# Patient Record
Sex: Female | Born: 1985 | Race: White | Hispanic: No | Marital: Single | Smoking: Never smoker
Health system: Southern US, Community
[De-identification: ages and names within clinical notes are randomized; demographics above are authoritative.]

## PROBLEM LIST (undated history)

## (undated) DIAGNOSIS — I10 Essential (primary) hypertension: Secondary | ICD-10-CM

## (undated) HISTORY — PX: CERVICAL CERCLAGE: SHX1329

---

## 2008-12-15 ENCOUNTER — Emergency Department (HOSPITAL_COMMUNITY): Admission: EM | Admit: 2008-12-15 | Discharge: 2008-12-15 | Payer: Self-pay | Admitting: Emergency Medicine

## 2014-04-24 ENCOUNTER — Other Ambulatory Visit (HOSPITAL_COMMUNITY): Payer: Self-pay | Admitting: Nurse Practitioner

## 2014-04-24 DIAGNOSIS — Z3A30 30 weeks gestation of pregnancy: Secondary | ICD-10-CM

## 2014-04-24 DIAGNOSIS — Z3689 Encounter for other specified antenatal screening: Secondary | ICD-10-CM

## 2014-04-24 DIAGNOSIS — O283 Abnormal ultrasonic finding on antenatal screening of mother: Secondary | ICD-10-CM

## 2014-05-02 ENCOUNTER — Encounter (HOSPITAL_COMMUNITY): Payer: Self-pay

## 2014-05-02 ENCOUNTER — Other Ambulatory Visit (HOSPITAL_COMMUNITY): Payer: Self-pay

## 2014-05-02 ENCOUNTER — Ambulatory Visit (HOSPITAL_COMMUNITY)
Admission: RE | Admit: 2014-05-02 | Discharge: 2014-05-02 | Disposition: A | Discharge status: Home / Self Care | Payer: Medicaid Other | Source: Ambulatory Visit | Attending: Nurse Practitioner | Admitting: Nurse Practitioner

## 2014-05-02 DIAGNOSIS — Z36 Encounter for antenatal screening of mother: Secondary | ICD-10-CM | POA: Diagnosis not present

## 2014-05-02 DIAGNOSIS — O289 Unspecified abnormal findings on antenatal screening of mother: Secondary | ICD-10-CM | POA: Insufficient documentation

## 2014-05-02 DIAGNOSIS — Z3689 Encounter for other specified antenatal screening: Secondary | ICD-10-CM | POA: Insufficient documentation

## 2014-05-02 DIAGNOSIS — O283 Abnormal ultrasonic finding on antenatal screening of mother: Secondary | ICD-10-CM

## 2014-05-02 DIAGNOSIS — Z3A30 30 weeks gestation of pregnancy: Secondary | ICD-10-CM | POA: Insufficient documentation

## 2014-05-02 HISTORY — DX: Essential (primary) hypertension: I10

## 2014-05-02 IMAGING — US US OB DETAIL+14 WK
1 series · 12 of 28 positions shown · non-contrast
Comparison: none

[Series 1: us ob detail+14 wk · 12 of 94 slices shown]
[im 4/94]
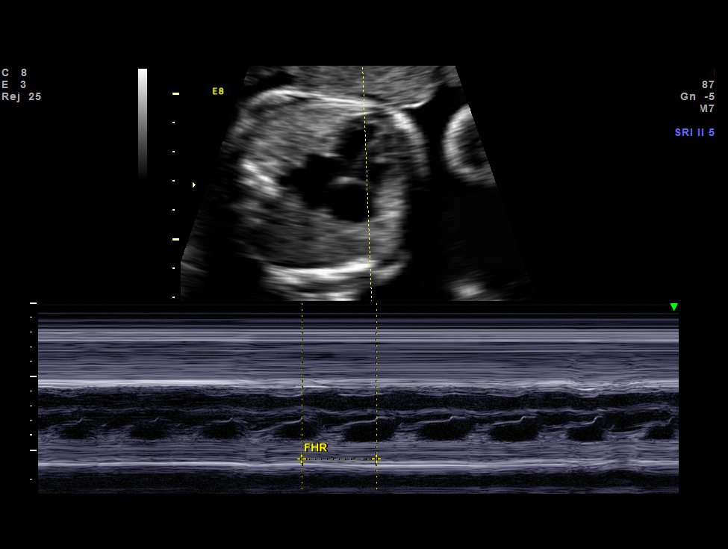
[im 11/94]
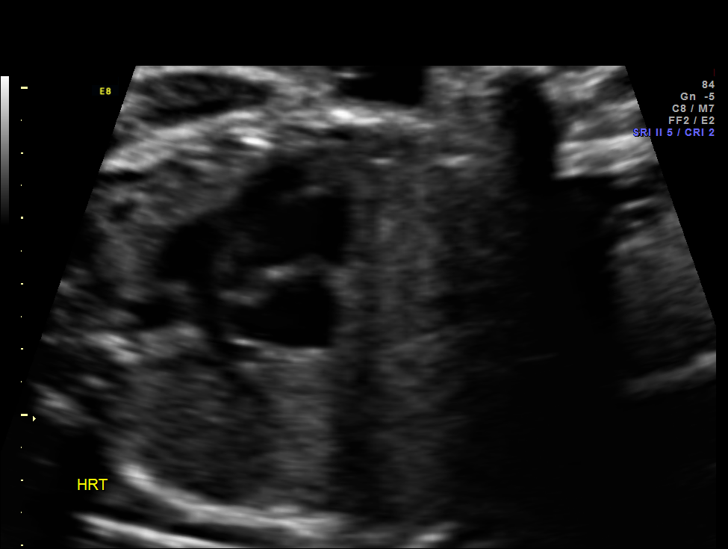
[im 18/94]
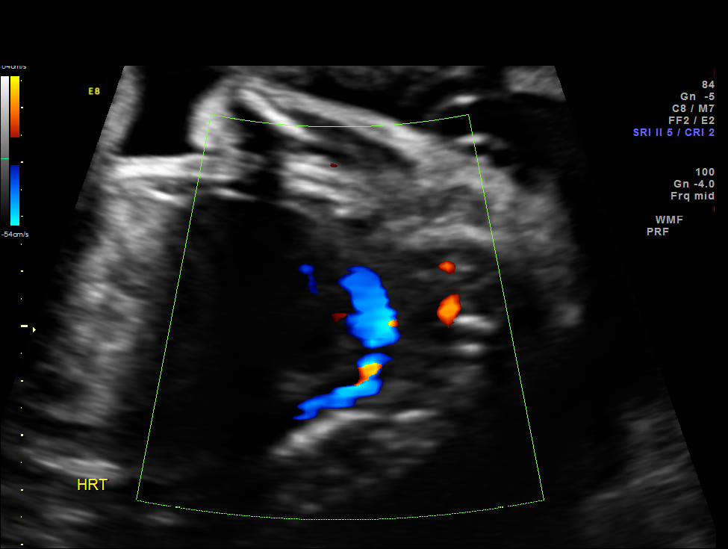
[im 28/94]
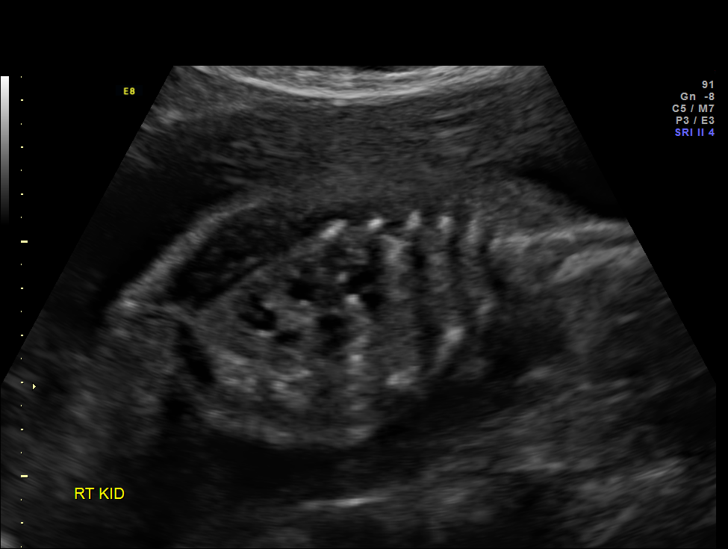
[im 35/94]
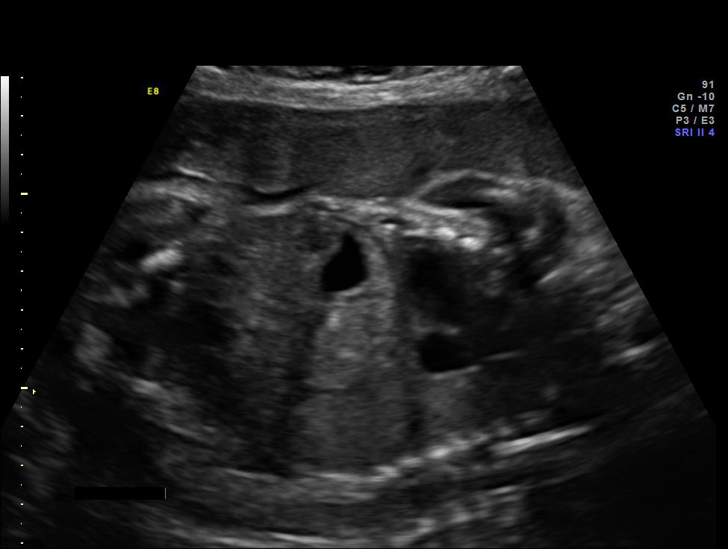
[im 42/94]
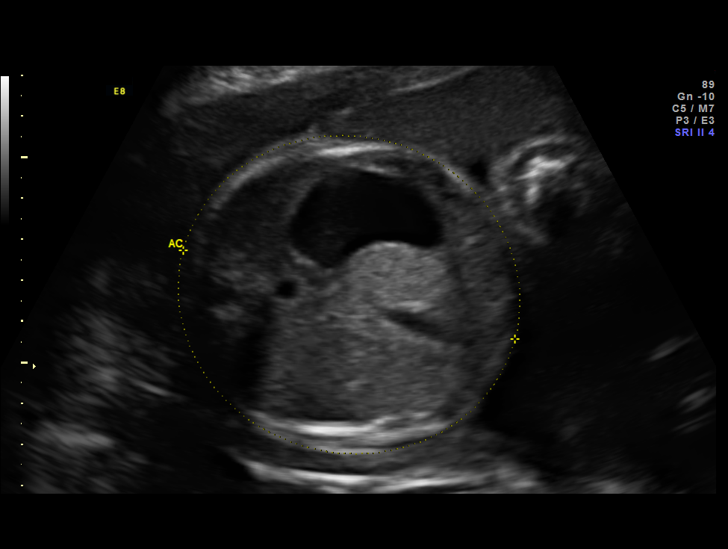
[im 52/94]
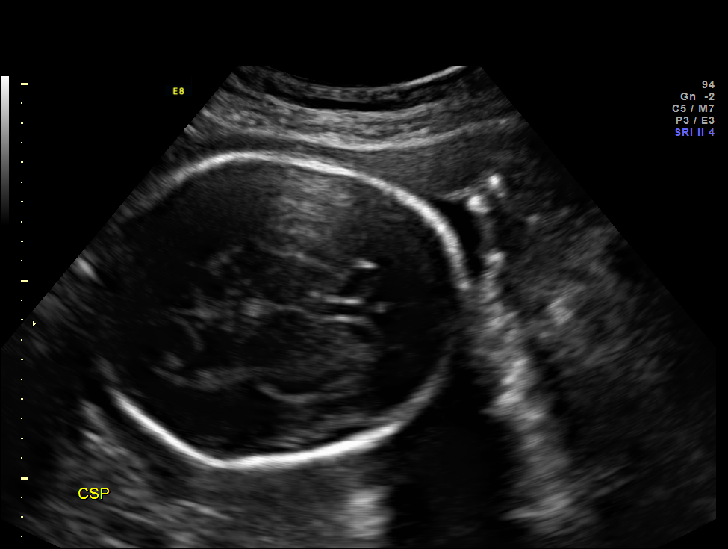
[im 59/94]
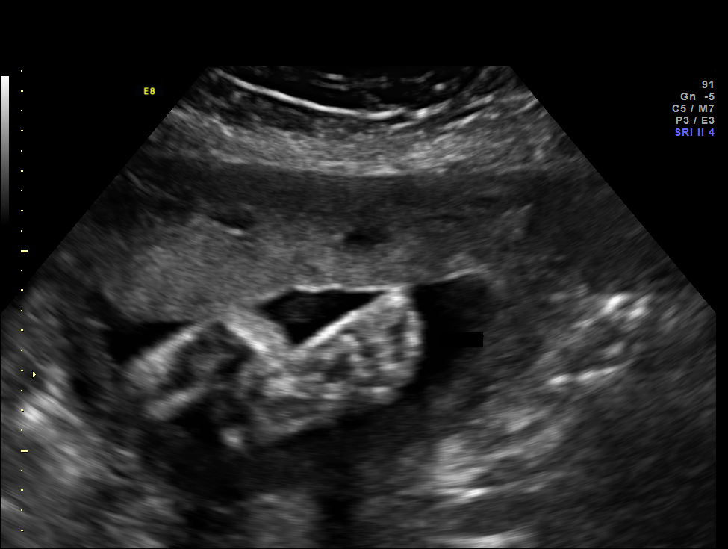
[im 66/94]
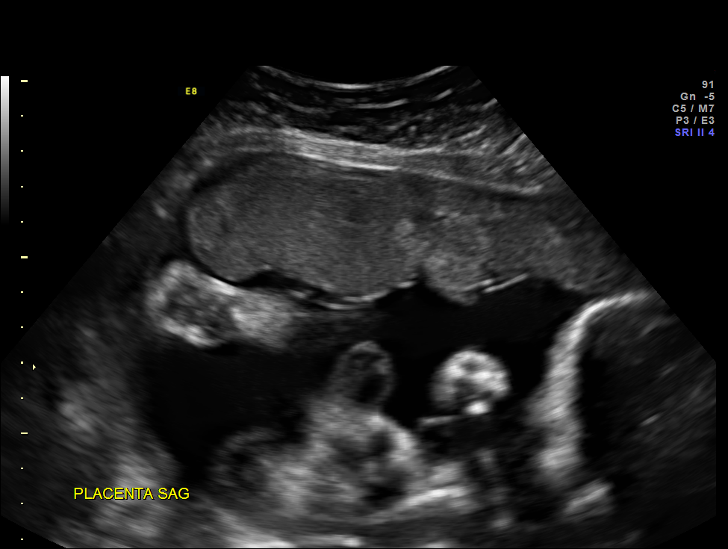
[im 76/94]
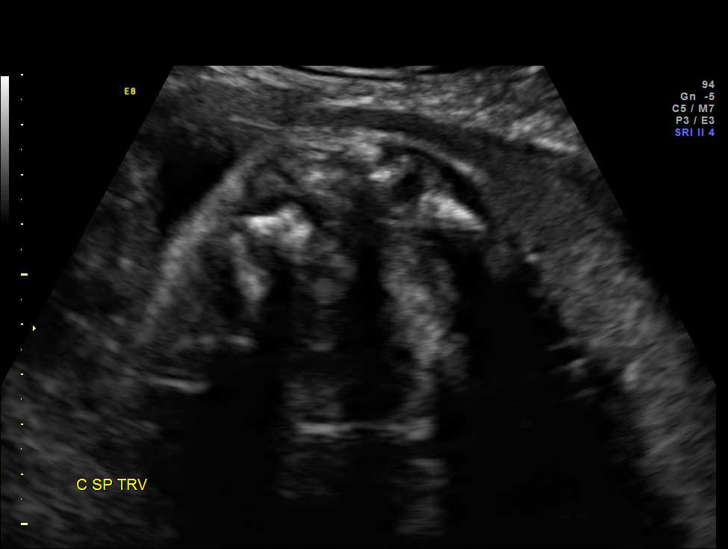
[im 83/94]
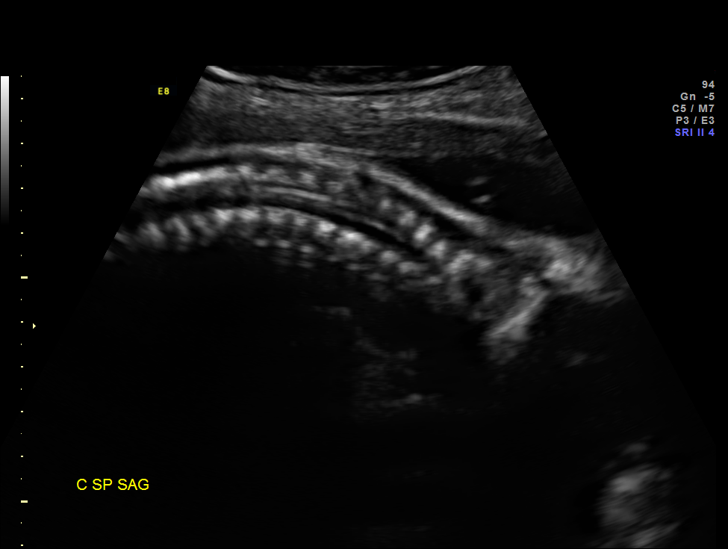
[im 90/94]
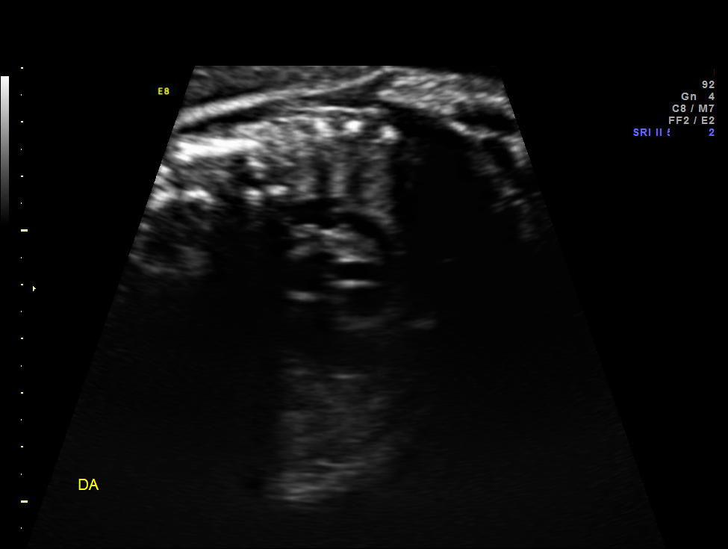

[12 of 28 positions shown; findings below may reference images not displayed]

OBSTETRICS REPORT
                      (Signed Final [DATE] [DATE])

Service(s) Provided

 US OB DETAIL + 14 WK                                  76811.0
Indications

 Detailed fetal anatomic survey                        Z36
 Urinary tract dilation of fetus on prenatal [ZK]
 Size less than dates (Small for gestational age,      [ZK]
 FGR)
 Hypertension - Chronic/Pre-existing (on labetalol)    [ZK]
 Poor obstetric history: Previous midtrimester loss    [ZK]
 (21 week demise, abruption, dilated cervix)
 Cervical insufficiency,3rd - cerclage [DATE],        [ZK]
 SAUSHAN injections
 30 weeks gestation of pregnancy
Fetal Evaluation

 Num Of Fetuses:    1
 Fetal Heart Rate:  135                          bpm
 Cardiac Activity:  Observed
 Presentation:      Cephalic
 Placenta:          Anterior, above cervical os
 P. Cord            Visualized, central
 Insertion:

 Amniotic Fluid
 AFI FV:      Subjectively within normal limits
 AFI Sum:     18.16   cm       68  %Tile      Larg Pckt:   5.87  cm
 RUQ:   5.87    cm   RLQ:    2.77   cm    LUQ:   4.97    cm    LLQ:   4.55    cm
Biometry

 BPD:     76.7  mm     G. Age:  30w 5d                CI:          77.8  70 - 86
 OFD:     98.6  mm                                    FL/HC:       19.2  19.2 -

 HC:     280.9  mm     G. Age:  30w 6d       33   %   HC/AC:       1.12  0.99 -

 AC:     251.5  mm     G. Age:  29w 2d       23   %   FL/BPD:      70.4  71 - 87
 FL:        54  mm     G. Age:  28w 4d         6  %   FL/AC:       21.5  20 - 24
 HUM:     49.4  mm     G. Age:  29w 0d       29   %
 CER:     36.3  mm     G. Age:  31w 2d       63   %

 Est. FW:    [ZK]   gm     3 lb 1 oz     37  %
Gestational Age

 LMP:           31w 4d        Date:  [DATE]                 EDD:   [DATE]
 U/S Today:     29w 6d                                        EDD:   [DATE]
 Best:          30w 1d     Det. By:  Early Ultrasound         EDD:   [DATE]
                                     ([DATE])
Anatomy

 Cranium:          Appears normal         Aortic Arch:      Appears normal
 Fetal Cavum:      Appears normal         Ductal Arch:      Appears normal
 Ventricles:       Appears normal         Diaphragm:        Appears normal
 Choroid Plexus:   Appears normal         Stomach:          Appears normal, left
                                                            sided
 Cerebellum:       Appears normal         Abdomen:          Appears normal
 Posterior Fossa:  Appears normal         Abdominal Wall:   Appears nml (cord
                                                            insert, abd wall)
 Nuchal Fold:      Not applicable (>20    Cord Vessels:     Appears normal (3
                   wks GA)                                  vessel cord)
 Face:             Appears normal         Kidneys:          Abnormal, see
                   (orbits and profile)
                                                            comments
 Lips:             Appears normal         Bladder:          Appears normal
 Heart:            Appears normal         Spine:            Appears normal
                   (4CH, axis, and
                   situs)
 RVOT:             Appears normal         Lower             Visualized
                                          Extremities:
 LVOT:             Appears normal         Upper             Visualized
                                          Extremities:

 Other:  Male gender. Heels visualized. Nasal bone visualized. Technically
         difficult due to advanced GA and fetal position.
Targeted Anatomy

 Fetal Central Nervous System
 Cisterna Magna:
Cervix Uterus Adnexa

 Cervix:       Not visualized (advanced GA >[ZK])
Impression

 SIUP at [ZK]
 EFW 37%
 right sided pyelectasis/urinary tract dilation:  10.8mm right
 renal pelvis AP, left renal pelvis AP 4mm
 No previa
Recommendations

 1. antenatal consultation with pediatric urology
 2. interval growth in 4 weeks.
 3. antenatal testing (NST 2x/wk with AFI weekly).

## 2014-05-02 NOTE — ED Notes (Signed)
Pt forgot to take her BP med yesterday.

## 2014-05-03 ENCOUNTER — Other Ambulatory Visit (HOSPITAL_COMMUNITY): Payer: Self-pay | Admitting: Nurse Practitioner

## 2014-05-03 DIAGNOSIS — O283 Abnormal ultrasonic finding on antenatal screening of mother: Secondary | ICD-10-CM

## 2014-06-02 ENCOUNTER — Other Ambulatory Visit (HOSPITAL_COMMUNITY): Payer: Self-pay | Admitting: Nurse Practitioner

## 2014-06-02 ENCOUNTER — Ambulatory Visit (HOSPITAL_COMMUNITY)
Admission: RE | Admit: 2014-06-02 | Discharge: 2014-06-02 | Disposition: A | Discharge status: Home / Self Care | Payer: Medicaid Other | Source: Ambulatory Visit | Attending: Nurse Practitioner | Admitting: Nurse Practitioner

## 2014-06-02 DIAGNOSIS — Z3A34 34 weeks gestation of pregnancy: Secondary | ICD-10-CM | POA: Insufficient documentation

## 2014-06-02 DIAGNOSIS — O289 Unspecified abnormal findings on antenatal screening of mother: Secondary | ICD-10-CM | POA: Diagnosis present

## 2014-06-02 DIAGNOSIS — O10919 Unspecified pre-existing hypertension complicating pregnancy, unspecified trimester: Secondary | ICD-10-CM | POA: Insufficient documentation

## 2014-06-02 DIAGNOSIS — O283 Abnormal ultrasonic finding on antenatal screening of mother: Secondary | ICD-10-CM

## 2014-06-02 NOTE — ED Notes (Signed)
Upon bringing patient back for vital signs, she realized she had not taken her blood pressure medicine.  Provided patient with water to take medicine.

## 2015-03-07 ENCOUNTER — Encounter (HOSPITAL_COMMUNITY): Payer: Self-pay | Admitting: *Deleted

## 2021-10-08 ENCOUNTER — Encounter: Payer: Self-pay | Admitting: Physical Therapy

## 2021-10-08 ENCOUNTER — Ambulatory Visit: Payer: Medicaid Other | Attending: Internal Medicine | Admitting: Physical Therapy

## 2021-10-08 DIAGNOSIS — M6281 Muscle weakness (generalized): Secondary | ICD-10-CM | POA: Diagnosis present

## 2021-10-08 DIAGNOSIS — M542 Cervicalgia: Secondary | ICD-10-CM

## 2021-10-08 DIAGNOSIS — G8929 Other chronic pain: Secondary | ICD-10-CM

## 2021-10-08 DIAGNOSIS — M25511 Pain in right shoulder: Secondary | ICD-10-CM | POA: Insufficient documentation

## 2021-10-08 NOTE — Therapy (Signed)
[facility] ?[address] ?[place], [place], [address] ?Phone: [phone]   Fax:  [phone] ? ?Physical Therapy Evaluation ? ?Patient Details  ?Name: Carol DupreSandra L Lowe ?MRN: [id number] ?Date of Birth: 11-03-1985 ?Referring Provider (PT): Gilman Schmidtickey Kirkland NP ? ? ?Encounter Date: 10/08/2021 ? ? PT End of Session - 10/08/21 1238   ? ? Visit Number 1   ? Number of Visits 12   ? Date for PT Re-Evaluation 11/19/21   ? PT Start Time 1032   ? PT Stop Time 1100   ? PT Time Calculation (min) 28 min   ? Activity Tolerance Patient tolerated treatment well   ? Behavior During Therapy [facility] for tasks assessed/performed   ? ?  ?  ? ?  ? ? ?Past Medical History:  ?Diagnosis Date  ? Hypertension   ? ? ?Past Surgical History:  ?Procedure Laterality Date  ? CERVICAL CERCLAGE    ? ? ?There were no vitals filed for this visit. ? ? ? Subjective Assessment - 10/08/21 1240   ? ? Subjective The patient presents to the clinic today with c/o chronic right-sided neck and shoulder pain as the result of an injury in 2013.  Her pain-level is rated at a 7/10 today. She will also experience numbness in her right hand especially the fourth and fifth fingers.  Injections in the past provided her some temporary relief.  Certain neck and right shoulder movements increase pain.   ? Pertinent History C-section x 2, HTN.   ? Patient Stated Goals Get out of pain.   ? Currently in Pain? Yes   ? Pain Score 7    ? Pain Location Neck   Right shoulder.  ? Pain Orientation Right   ? Pain Descriptors / Indicators Aching;Throbbing;Sharp;Numbness;Tingling   ? Pain Type Chronic pain   ? Pain Radiating Towards Right shoulder.   ? Pain Onset More than a month ago   ? Pain Frequency Constant   ? Aggravating Factors  See above.   ? Pain Relieving Factors Rest.   ? Effect of Pain on Daily Activities Increase pain with caring for infant daughter.   ? ?  ?  ? ?  ? ? ? ? ? [facility] PT Assessment - 10/08/21 0001   ? ?  ? Assessment  ?  Medical Diagnosis Right-sided cervical strain.   ? Referring Provider (PT) Gilman Schmidtickey Kirkland NP   ? Onset Date/Surgical Date --   2013.  ?  ? Precautions  ? Precautions None   ?  ? Restrictions  ? Weight Bearing Restrictions No   ?  ? Balance Screen  ? Has the patient fallen in the past 6 months No   ? Has the patient had a decrease in activity level because of a fear of falling?  No   ? Is the patient reluctant to leave their home because of a fear of falling?  No   ?  ? Home Environment  ? Living Environment Private residence   ?  ? Prior Function  ? Level of Independence Independent   ?  ? Posture/Postural Control  ? Posture/Postural Control Postural limitations   ? Postural Limitations Rounded Shoulders;Forward head   ?  ? Deep Tendon Reflexes  ? DTR Assessment Site --   Right Brachioradialis less brisk than left.  ?  ? ROM / Strength  ? AROM / PROM / Strength AROM;Strength   ?  ? AROM  ? Overall AROM Comments Bilateral active cervical rotation  is limited to 60 degrees.  Active right shoulder flexion to 135 degrees limited by pain but full passively.   ?  ? Strength  ? Overall Strength Comments Right shoulder ER is 4-/5, right biceps is 4/5.  Right grip (dominant side) is 55# and left is 47#.   ?  ? Palpation  ? Palpation comment Very tender and taut to palpation over right UT with an active trigger point.   ?  ? Special Tests  ? Other special tests Pain reproduction with right shoulder Impingement testing.   ?  ? Ambulation/Gait  ? Gait Comments WNL.   ? ?  ?  ? ?  ? ? ? ? ? ? ? ? ? ? ? ? ? ?Objective measurements completed on examination: See above findings.  ? ? ? ? ? ? ? ? ? ? ? ? ? ? ? ? ? ? ? PT Long Term Goals - 10/08/21 1258   ? ?  ? PT LONG TERM GOAL #1  ? Title Independent with a HEP.   ? Baseline No knowledge of appropriate ther ex.   ? Time 6   ? Period Weeks   ? Status New   ?  ? PT LONG TERM GOAL #2  ? Title Increase active cervical rotation to 70 degrees+ so patient can turn head more easily while  driving.   ? Baseline 60 degrees bilaterally.   ? Time 6   ? Period Weeks   ? Status New   ?  ? PT LONG TERM GOAL #3  ? Title Eliminate UE symptoms.   ? Baseline Pain radiates into right shoulder.   ? Time 6   ? Period Weeks   ? Status New   ?  ? PT LONG TERM GOAL #4  ? Title Full active right shoulder flexion.   ? Baseline 135 degrees.   ? Time 6   ? Period Weeks   ? Status New   ?  ? PT LONG TERM GOAL #5  ? Title Perform ADL's with pain not > 3/10.   ? Baseline Pain can rise to a 10/10 when performing ADL's.   ? Time 6   ? Period Weeks   ? Status New   ? ?  ?  ? ?  ? ? ? ? ? ? ? ? ? Plan - 10/08/21 1250   ? ? Clinical Impression Statement The patient presents to OPPT with chronic right-sided neck and shoulder pain.  She has limited active cervical rotation bilaterally and right shoulder flexion.  She is very tender to palpation over her right UT that exhibits a great deal of tone.  She is weak into right shoulder Er and elbow flexion per contralateral comparison.  She demonstrates pain reproduction with Impingement testing of the right shoulder.  Performing ADL's are very painful for her.  Patient will benefit from skilled physical therapy intervention to address pain and deficits.   ? Personal Factors and Comorbidities Comorbidity 1;Time since onset of injury/illness/exacerbation   ? Comorbidities HTN, C-section x 2.   ? Examination-Activity Limitations Other;Carry;Caring for Others   ? Examination-Participation Restrictions Other   ? Stability/Clinical Decision Making Stable/Uncomplicated   ? Clinical Decision Making Low   ? Rehab Potential Good   ? PT Frequency 2x / week   ? PT Duration 6 weeks   ? PT Treatment/Interventions ADLs/Self Care Home Management;Cryotherapy;Electrical Stimulation;Ultrasound;Traction;Moist Heat;Therapeutic activities;Therapeutic exercise;Manual techniques;Patient/family education;Passive range of motion;Dry needling   ? PT Next Visit Plan  Combo e'stim/US; STW/M, right UT release  techniques, right UE strengthening, RW4.   ? Consulted and Agree with Plan of Care Patient   ? ?  ?  ? ?  ? ? ?Patient will benefit from skilled therapeutic intervention in order to improve the following deficits and impairments:  Decreased activity tolerance, Decreased strength, Increased muscle spasms, Postural dysfunction, Pain ? ?Visit Diagnosis: ?Cervicalgia - Plan: PT plan of care cert/re-cert ? ?Chronic right shoulder pain - Plan: PT plan of care cert/re-cert ? ?Muscle weakness (generalized) - Plan: PT plan of care cert/re-cert ? ? ? ? ?Problem List ?Patient Active Problem List  ? Diagnosis Date Noted  ? Abnormal ultrasonic finding on antenatal screening of mother, delivered, with mention of postpartum complication   ? Chronic hypertension in obstetric context   ? [redacted] weeks gestation of pregnancy   ? [redacted] weeks gestation of pregnancy   ? Abnormal fetal ultrasound   ? Encounter for fetal anatomic survey   ? ? ?Marquesha Robideau, [place], PT ?10/08/2021, 1:03 PM ? ? ?[facility] ?[address] ?[place], [place], [address] ?Phone: [phone]   Fax:  [phone] ? ?Name: CHARNELE SEMPLE ?MRN: [id number] ?Date of Birth: Aug 27, 1985 ? ? ?

## 2023-01-14 ENCOUNTER — Ambulatory Visit: Payer: Medicaid Other | Attending: Orthopedic Surgery | Admitting: Physical Therapy
